# Patient Record
Sex: Female | Born: 1955 | Race: White | Hispanic: No | Marital: Married | Smoking: Never smoker
Health system: Southern US, Community
[De-identification: ages and names within clinical notes are randomized; demographics above are authoritative.]

## PROBLEM LIST (undated history)

## (undated) DIAGNOSIS — G8929 Other chronic pain: Secondary | ICD-10-CM

## (undated) DIAGNOSIS — E119 Type 2 diabetes mellitus without complications: Secondary | ICD-10-CM

## (undated) DIAGNOSIS — C4491 Basal cell carcinoma of skin, unspecified: Secondary | ICD-10-CM

## (undated) DIAGNOSIS — J329 Chronic sinusitis, unspecified: Secondary | ICD-10-CM

## (undated) DIAGNOSIS — M549 Dorsalgia, unspecified: Secondary | ICD-10-CM

## (undated) HISTORY — PX: TUBAL LIGATION: SHX77

## (undated) HISTORY — PX: APPENDECTOMY: SHX54

## (undated) HISTORY — PX: ANKLE FRACTURE SURGERY: SHX122

## (undated) HISTORY — PX: MULTIPLE TOOTH EXTRACTIONS: SHX2053

## (undated) HISTORY — PX: EYE SURGERY: SHX253

---

## 2013-10-26 ENCOUNTER — Ambulatory Visit: Payer: Self-pay

## 2013-11-09 ENCOUNTER — Ambulatory Visit: Payer: Self-pay

## 2014-12-11 ENCOUNTER — Emergency Department (HOSPITAL_COMMUNITY)
Admission: EM | Admit: 2014-12-11 | Discharge: 2014-12-11 | Disposition: A | Discharge status: Home / Self Care | Payer: 59 | Attending: Emergency Medicine | Admitting: Emergency Medicine

## 2014-12-11 ENCOUNTER — Emergency Department (HOSPITAL_COMMUNITY): Payer: 59

## 2014-12-11 ENCOUNTER — Encounter (HOSPITAL_COMMUNITY): Payer: Self-pay | Admitting: *Deleted

## 2014-12-11 DIAGNOSIS — R509 Fever, unspecified: Secondary | ICD-10-CM | POA: Diagnosis present

## 2014-12-11 DIAGNOSIS — J0101 Acute recurrent maxillary sinusitis: Secondary | ICD-10-CM | POA: Diagnosis not present

## 2014-12-11 HISTORY — DX: Type 2 diabetes mellitus without complications: E11.9

## 2014-12-11 HISTORY — DX: Other chronic pain: G89.29

## 2014-12-11 HISTORY — DX: Chronic sinusitis, unspecified: J32.9

## 2014-12-11 HISTORY — DX: Dorsalgia, unspecified: M54.9

## 2014-12-11 MED ORDER — IPRATROPIUM BROMIDE 0.02 % IN SOLN
0.5000 mg | Freq: Once | RESPIRATORY_TRACT | Status: AC
  Administered 2014-12-11: 0.5 mg via RESPIRATORY_TRACT
  Filled 2014-12-11: qty 2.5

## 2014-12-11 MED ORDER — ACETAMINOPHEN 325 MG PO TABS
650.0000 mg | ORAL_TABLET | Freq: Once | ORAL | Status: AC
  Administered 2014-12-11: 650 mg via ORAL
  Filled 2014-12-11: qty 2

## 2014-12-11 MED ORDER — ONDANSETRON 4 MG PO TBDP
8.0000 mg | ORAL_TABLET | Freq: Once | ORAL | Status: AC
  Administered 2014-12-11: 8 mg via ORAL
  Filled 2014-12-11: qty 2

## 2014-12-11 MED ORDER — PHENYLEPHRINE-DM-GG-APAP 5-10-200-325 MG PO TABS: 2.0000 | ORAL_TABLET | Freq: Three times a day (TID) | ORAL | Status: DC

## 2014-12-11 MED ORDER — ALBUTEROL SULFATE (2.5 MG/3ML) 0.083% IN NEBU
5.0000 mg | INHALATION_SOLUTION | Freq: Once | RESPIRATORY_TRACT | Status: AC
  Administered 2014-12-11: 5 mg via RESPIRATORY_TRACT
  Filled 2014-12-11: qty 6

## 2014-12-11 MED ORDER — ONDANSETRON HCL 4 MG PO TABS: 4.0000 mg | ORAL_TABLET | Freq: Four times a day (QID) | ORAL | Status: DC

## 2014-12-11 MED ORDER — PREDNISONE 20 MG PO TABS: 40.0000 mg | ORAL_TABLET | Freq: Every day | ORAL | Status: DC

## 2014-12-11 MED ORDER — IBUPROFEN 400 MG PO TABS
600.0000 mg | ORAL_TABLET | Freq: Once | ORAL | Status: AC
  Administered 2014-12-11: 400 mg via ORAL
  Filled 2014-12-11: qty 2

## 2014-12-11 MED ORDER — AEROCHAMBER PLUS FLO-VU MEDIUM MISC
1.0000 | Freq: Once | Status: AC
  Administered 2014-12-11: 1
  Filled 2014-12-11: qty 1

## 2014-12-11 MED ORDER — ALBUTEROL SULFATE HFA 108 (90 BASE) MCG/ACT IN AERS
2.0000 | INHALATION_SPRAY | RESPIRATORY_TRACT | Status: AC
  Administered 2014-12-11: 2 via RESPIRATORY_TRACT
  Filled 2014-12-11: qty 6.7

## 2014-12-11 NOTE — ED Notes (Addendum)
States was seen at the clinic on 11/12/14 and was Amox for sinus infection. States no improvement and had temp 102 - clinic prescribed Levoflaxacin and Fluticasone Spray and Claritin. States has temp 99 this am and no improvement. States has not taken any meds for temp. Pt also c/o cough - nonprod. States vomited yesterday.

## 2014-12-11 NOTE — ED Notes (Signed)
Declined W/C at D/C and was escorted to lobby by RN. 

## 2014-12-11 NOTE — ED Provider Notes (Signed)
CSN: [id number]     Arrival date & time 12/11/14  1102 History  This chart was scribed for Donetta Potts, NP, working with Elwin Mocha, MDrett Adriana Simas, ED Scribe. This patient was seen in room TR05C/TR05C and the patient's care was started at 11:30 AM.   No chief complaint on file.  The history is provided by the patient. No language interpreter was used.   HPI Comments: Sara Mcclain is a 59 y.o. female who presents to the Emergency Department complaining of sinus pressure with associated fever onset > 1 month ago as well as a non-productive cough onset within the past several days.  The patient reports she was seen at the clinic on 3/19 and prescribed amoxicillin.  Her sinus pressure improved minorly and transiently but recurred.  When she developed a fever TMAX 102 on 4/13, she returned to the clinic and was prescribed a 10 day course of levofloxacin, 5 days ago.  At that time, she was also started on Fluticasone and Mucinex.  Patient has a history of DM which is controlled with metformin and glimepiride, however, she notes it has been running in the mid to high 100s recently because she avoided taking her medicines.  She reports 4/10 pain in her face and describes it as throbbing. She denies vision changes, eye pain, current fever, neck stiffness or chills.     No current facility-administered medications on file prior to encounter.   No current outpatient prescriptions on file prior to encounter.   No past medical history on file. No past surgical history on file. No family history on file. History  Substance Use Topics  . Smoking status: Not on file  . Smokeless tobacco: Not on file  . Alcohol Use: Not on file   OB History    No data available     Review of Systems  Constitutional: Negative for fever.  HENT: Positive for postnasal drip and sinus pressure.   Respiratory: Positive for cough.     Allergies  Review of patient's allergies indicates not on file.  Home Medications    Prior to Admission medications   Not on File   There were no vitals taken for this visit. Physical Exam  Constitutional: She is oriented to person, place, and time. She appears well-developed and well-nourished. No distress.  HENT:  Head: Normocephalic and atraumatic.  Right Ear: External ear normal.  Left Ear: External ear normal.  Nose: Mucosal edema and rhinorrhea present. Right sinus exhibits maxillary sinus tenderness. Left sinus exhibits maxillary sinus tenderness.  Mouth/Throat: Oropharynx is clear and moist. Mucous membranes are not dry. No oropharyngeal exudate, posterior oropharyngeal edema or posterior oropharyngeal erythema.  Eyes: Conjunctivae and EOM are normal. Pupils are equal, round, and reactive to light.  Neck: Normal range of motion. Neck supple. No tracheal deviation present.  Cardiovascular: Normal rate, regular rhythm and intact distal pulses.   Pulmonary/Chest: Effort normal. No respiratory distress. She has wheezes ( mild) in the right middle field and the left middle field. She has no rhonchi. She has no rales. She exhibits no tenderness.  Abdominal: Soft.  Musculoskeletal: Normal range of motion.  Neurological: She is alert and oriented to person, place, and time.  Skin: Skin is warm and dry.  Psychiatric: She has a normal mood and affect.  Nursing note and vitals reviewed.   ED Course  Procedures (including critical care time)  DIAGNOSTIC STUDIES: Oxygen Saturation is 97% on RA, normal by my interpretation.    COORDINATION OF CARE:  11:45 AM Discussed treatment plan with patient at bedside.  Patient acknowledges and agrees with plan.    Labs Review Labs Reviewed - No data to display  Imaging Review Dg Chest 2 View  12/11/2014   CLINICAL DATA:  Patient with 1 month of sinus problems. Fever. Cough and congestion.  EXAM: CHEST  2 VIEW  COMPARISON:  None.  FINDINGS: Normal cardiac and mediastinal contours. No consolidative pulmonary opacities. No  pleural effusion or pneumothorax. Regional skeleton is unremarkable.  IMPRESSION: No acute cardiopulmonary process.   Electronically Signed   By: Annia Beltrew  Davis M.D.   On: 12/11/2014 12:38     EKG Interpretation None      MDM   Final diagnoses:  Acute recurrent maxillary sinusitis   59 yo complaining of symptoms of sinusitis.  She is currently half-way through course of levaquin after a course of amoxicillin.  She no longer has purulent discharge and after start of abx her Tmax has only been 99.0. CXR due to persistent cough and mild wheeze and is negative. Symptoms managed in ED and improved with albuterol neb, tylenol and ibuprofen and zofran and oral hydration.  Instructions to finish course of levaquin, use warm saline nasal wash and recommendations for follow-up with primary care physician. Also recommended prednisone daily x 5 days, albuterol inhaler 2 puffs every 4 hours for cough and multi-symptom cold medicine 3 times a day.  Pt is well-appearing, in no acute distress and vital signs reviewed and not concerning. She appears safe to be discharged.  Return precautions provided.  Pt aware of plan and in agreement.    I personally performed the services described in this documentation, which was scribed in my presence. The recorded information has been reviewed and is accurate.  Filed Vitals:   12/11/14 1112 12/11/14 1125 12/11/14 1251 12/11/14 1336  BP: 120/61   102/65  Pulse: 102   98  Temp: 99.5 F (37.5 C)  98 F (36.7 C) 98.3 F (36.8 C)  TempSrc: Oral  Oral Oral  Resp: 20   20  Height:  5' (1.524 m)    Weight:  164 lb 7 oz (74.588 kg)    SpO2: 97%   97%   Meds given in ED:  Medications  albuterol (PROVENTIL) (2.5 MG/3ML) 0.083% nebulizer solution 5 mg (5 mg Nebulization Given 12/11/14 1150)  ipratropium (ATROVENT) nebulizer solution 0.5 mg (0.5 mg Nebulization Given 12/11/14 1150)  acetaminophen (TYLENOL) tablet 650 mg (650 mg Oral Given 12/11/14 1253)  ibuprofen  (ADVIL,MOTRIN) tablet 600 mg (400 mg Oral Given 12/11/14 1253)  ondansetron (ZOFRAN-ODT) disintegrating tablet 8 mg (8 mg Oral Given 12/11/14 1149)  albuterol (PROVENTIL HFA;VENTOLIN HFA) 108 (90 BASE) MCG/ACT inhaler 2 puff (2 puffs Inhalation Given 12/11/14 1333)  AEROCHAMBER PLUS FLO-VU MEDIUM device MISC 1 each (1 each Other Given 12/11/14 1334)    Discharge Medication List as of 12/11/2014  1:17 PM     12/11/14 0000  ondansetron (ZOFRAN) 4 MG tablet Every 6 hours Discontinue Reprint 12/11/14 1315   12/11/14 0000  Phenylephrine-DM-GG-APAP 5-10-200-325 MG TABS 3 times daily Discontinue Reprint 12/11/14 1315        Harle BattiestElizabeth Baker Kogler, NP 12/11/14 1952  Elwin MochaBlair Walden, MD 12/12/14 [phone]

## 2014-12-11 NOTE — Discharge Instructions (Signed)
Please follow the directions provided. Be sure to follow-up with your primary care doctor to ensure you're getting better. Please continue your course of Levaquin until it is all gone. Please start taking the prescribed multisymptom cold medicine 3 times a day and your symptoms have resolved. You may use the Zofran as needed for any nausea. Please use the albuterol inhaler with a spacer, 2 puffs every 4 hours to help with cough and or wheezing. Be sure to drink plenty of fluids by mouth to stay well hydrated and resume taking all prescribed diabetes medicines. Don't hesitate to return for any new, worsening, or concerning symptoms.   SEEK IMMEDIATE MEDICAL CARE IF:  You have increasing pain or severe headaches.  You have nausea, vomiting, or drowsiness.  You have swelling around your face.  You have vision problems.  You have a stiff neck.  You have difficulty breathing.

## 2015-01-04 ENCOUNTER — Other Ambulatory Visit: Payer: Self-pay | Admitting: Family Medicine

## 2015-01-04 ENCOUNTER — Other Ambulatory Visit (HOSPITAL_COMMUNITY)
Admission: RE | Admit: 2015-01-04 | Discharge: 2015-01-04 | Disposition: A | Discharge status: Home / Self Care | Payer: 59 | Source: Ambulatory Visit | Attending: Family Medicine | Admitting: Family Medicine

## 2015-01-04 DIAGNOSIS — Z124 Encounter for screening for malignant neoplasm of cervix: Secondary | ICD-10-CM | POA: Diagnosis present

## 2015-01-04 DIAGNOSIS — Z1151 Encounter for screening for human papillomavirus (HPV): Secondary | ICD-10-CM | POA: Insufficient documentation

## 2015-01-09 LAB — CYTOLOGY - PAP

## 2015-01-10 ENCOUNTER — Other Ambulatory Visit: Payer: Self-pay | Admitting: Obstetrics and Gynecology

## 2015-01-11 ENCOUNTER — Other Ambulatory Visit: Payer: Self-pay

## 2015-01-11 DIAGNOSIS — Z1231 Encounter for screening mammogram for malignant neoplasm of breast: Secondary | ICD-10-CM

## 2015-01-25 ENCOUNTER — Ambulatory Visit: Payer: 59

## 2015-01-26 ENCOUNTER — Ambulatory Visit
Admission: RE | Admit: 2015-01-26 | Discharge: 2015-01-26 | Disposition: A | Discharge status: Home / Self Care | Payer: 59 | Source: Ambulatory Visit

## 2015-01-26 DIAGNOSIS — Z1231 Encounter for screening mammogram for malignant neoplasm of breast: Secondary | ICD-10-CM

## 2016-12-18 ENCOUNTER — Other Ambulatory Visit: Payer: Self-pay | Admitting: Family Medicine

## 2016-12-18 DIAGNOSIS — R1031 Right lower quadrant pain: Secondary | ICD-10-CM

## 2016-12-19 ENCOUNTER — Other Ambulatory Visit: Payer: Self-pay | Admitting: Family Medicine

## 2016-12-19 DIAGNOSIS — R1031 Right lower quadrant pain: Secondary | ICD-10-CM

## 2016-12-20 ENCOUNTER — Ambulatory Visit
Admission: RE | Admit: 2016-12-20 | Discharge: 2016-12-20 | Disposition: A | Discharge status: Home / Self Care | Payer: Commercial Managed Care - PPO | Source: Ambulatory Visit | Attending: Family Medicine | Admitting: Family Medicine

## 2016-12-20 DIAGNOSIS — R1031 Right lower quadrant pain: Secondary | ICD-10-CM

## 2017-01-27 ENCOUNTER — Other Ambulatory Visit (HOSPITAL_COMMUNITY)
Admission: RE | Admit: 2017-01-27 | Discharge: 2017-01-27 | Disposition: A | Discharge status: Home / Self Care | Payer: Commercial Managed Care - PPO | Source: Ambulatory Visit | Attending: Obstetrics and Gynecology | Admitting: Obstetrics and Gynecology

## 2017-01-27 ENCOUNTER — Other Ambulatory Visit: Payer: Self-pay | Admitting: Obstetrics and Gynecology

## 2017-01-27 DIAGNOSIS — Z01419 Encounter for gynecological examination (general) (routine) without abnormal findings: Secondary | ICD-10-CM | POA: Diagnosis not present

## 2017-01-29 LAB — CYTOLOGY - PAP
Diagnosis: NEGATIVE
HPV: NOT DETECTED

## 2017-03-05 ENCOUNTER — Ambulatory Visit: Payer: Commercial Managed Care - PPO | Admitting: Registered"

## 2017-03-20 ENCOUNTER — Other Ambulatory Visit: Payer: Self-pay | Admitting: Obstetrics and Gynecology

## 2017-03-20 DIAGNOSIS — Z1231 Encounter for screening mammogram for malignant neoplasm of breast: Secondary | ICD-10-CM

## 2017-03-21 ENCOUNTER — Ambulatory Visit
Admission: RE | Admit: 2017-03-21 | Discharge: 2017-03-21 | Disposition: A | Discharge status: Home / Self Care | Payer: Commercial Managed Care - PPO | Source: Ambulatory Visit | Attending: Obstetrics and Gynecology | Admitting: Obstetrics and Gynecology

## 2017-03-21 DIAGNOSIS — Z1231 Encounter for screening mammogram for malignant neoplasm of breast: Secondary | ICD-10-CM

## 2019-06-02 ENCOUNTER — Other Ambulatory Visit: Payer: Self-pay | Admitting: Obstetrics and Gynecology

## 2019-06-02 DIAGNOSIS — Z1231 Encounter for screening mammogram for malignant neoplasm of breast: Secondary | ICD-10-CM

## 2019-07-19 ENCOUNTER — Ambulatory Visit: Payer: Commercial Managed Care - PPO

## 2019-09-08 ENCOUNTER — Other Ambulatory Visit: Payer: Self-pay

## 2019-09-08 ENCOUNTER — Ambulatory Visit
Admission: RE | Admit: 2019-09-08 | Discharge: 2019-09-08 | Disposition: A | Discharge status: Home / Self Care | Payer: Commercial Managed Care - PPO | Source: Ambulatory Visit | Attending: Obstetrics and Gynecology | Admitting: Obstetrics and Gynecology

## 2019-09-08 DIAGNOSIS — Z1231 Encounter for screening mammogram for malignant neoplasm of breast: Secondary | ICD-10-CM

## 2019-09-24 ENCOUNTER — Ambulatory Visit (HOSPITAL_BASED_OUTPATIENT_CLINIC_OR_DEPARTMENT_OTHER)
Admission: RE | Admit: 2019-09-24 | Discharge: 2019-09-24 | Disposition: A | Discharge status: Home / Self Care | Payer: Commercial Managed Care - PPO | Source: Ambulatory Visit | Attending: Family Medicine | Admitting: Family Medicine

## 2019-09-24 ENCOUNTER — Other Ambulatory Visit: Payer: Self-pay

## 2019-09-24 ENCOUNTER — Other Ambulatory Visit (HOSPITAL_BASED_OUTPATIENT_CLINIC_OR_DEPARTMENT_OTHER): Payer: Self-pay | Admitting: Family Medicine

## 2019-09-24 DIAGNOSIS — R3989 Other symptoms and signs involving the genitourinary system: Secondary | ICD-10-CM

## 2019-09-24 DIAGNOSIS — M549 Dorsalgia, unspecified: Secondary | ICD-10-CM | POA: Diagnosis present

## 2021-08-08 IMAGING — MG DIGITAL SCREENING BILAT W/ TOMO W/ CAD
8 series · 8 of 24 positions shown · non-contrast
Comparison: Previous exam(s).

CLINICAL DATA: Screening.

EXAM:
DIGITAL SCREENING BILATERAL MAMMOGRAM WITH TOMO AND CAD

[L CC synth-2D]
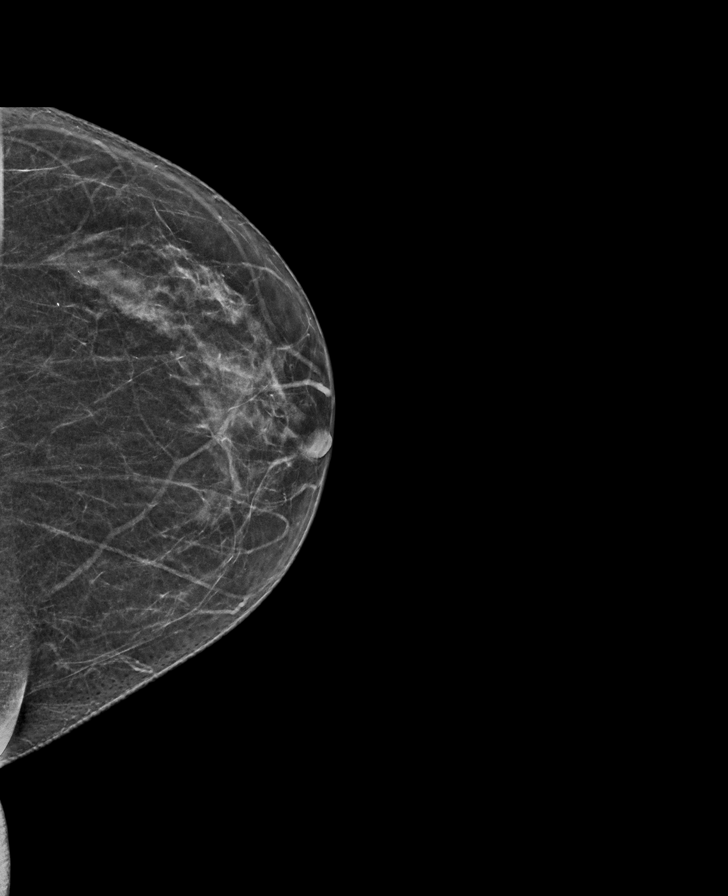

[R MLO synth-2D]
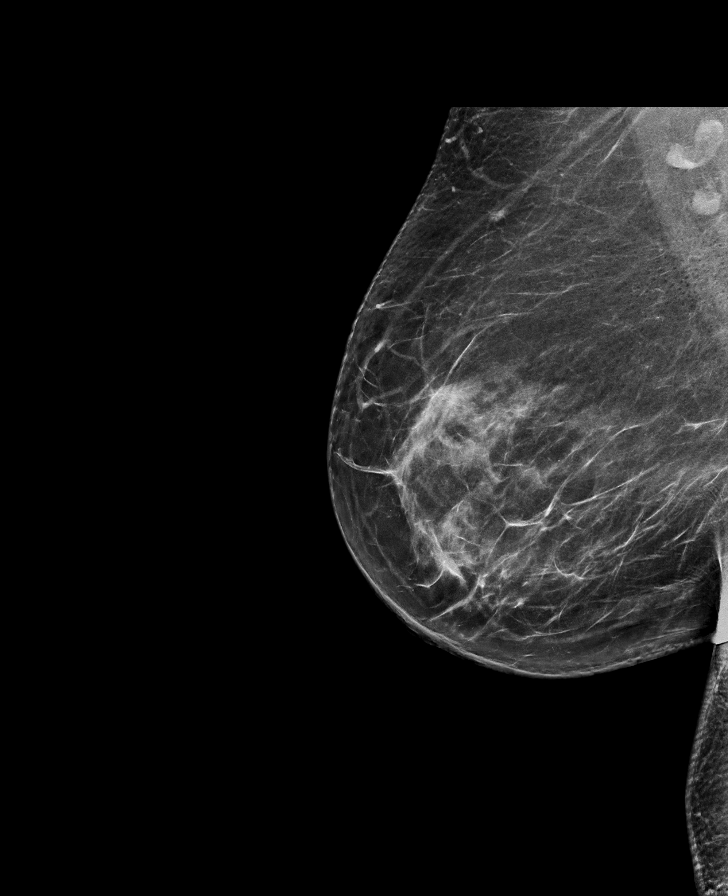

[R CC synth-2D]
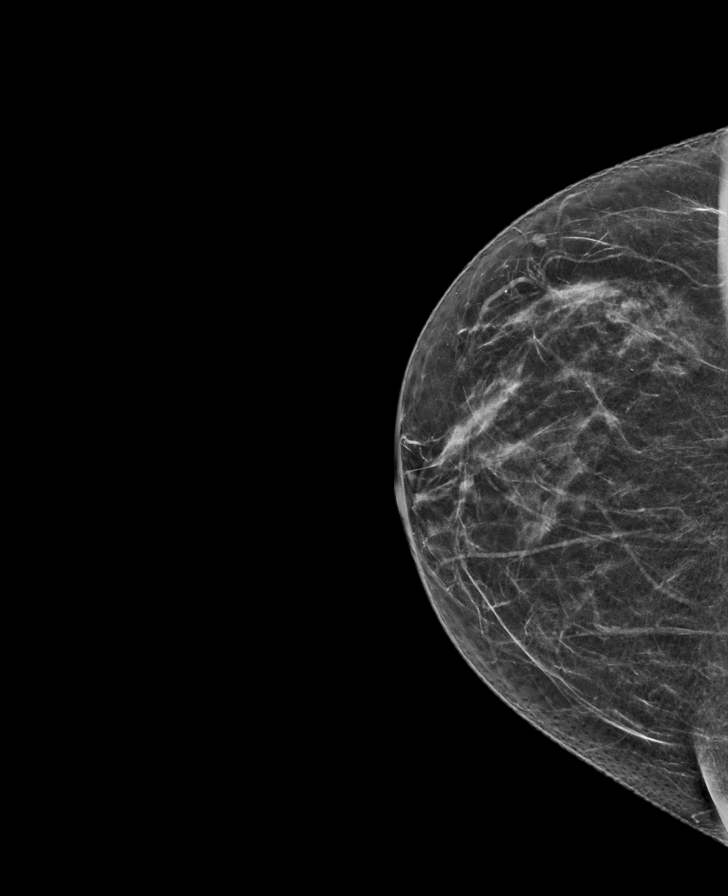

[L MLO synth-2D]
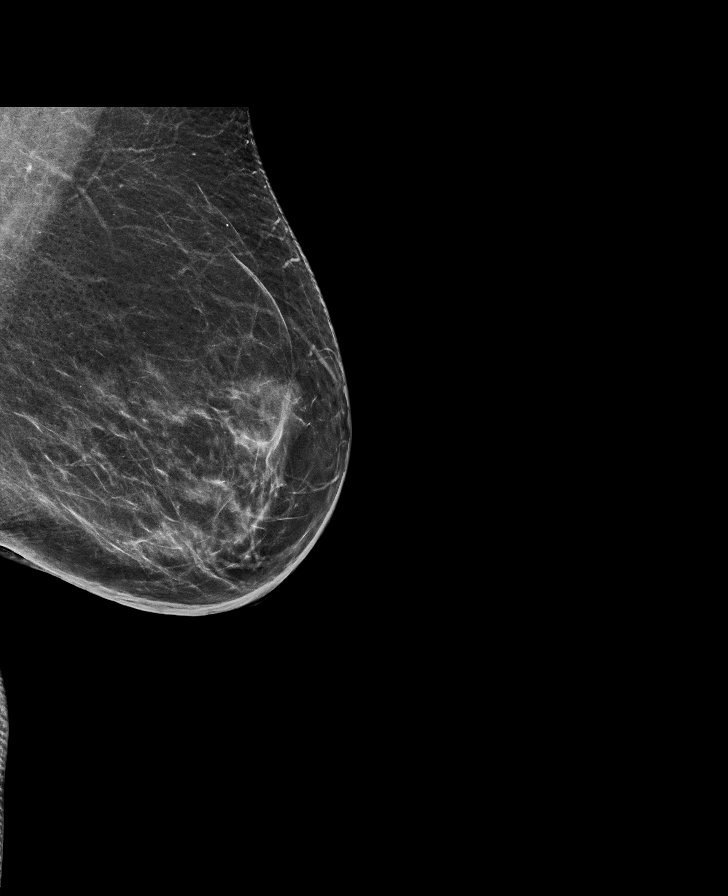

[R CC tomo · tomo slice 30/59.0]
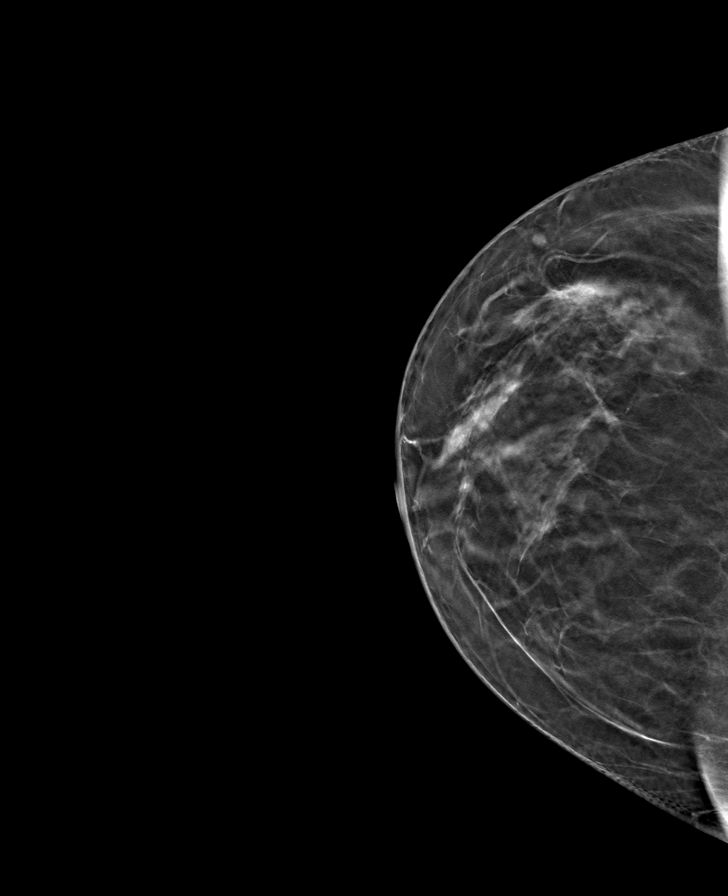

[L MLO tomo · tomo slice 37/74.0]
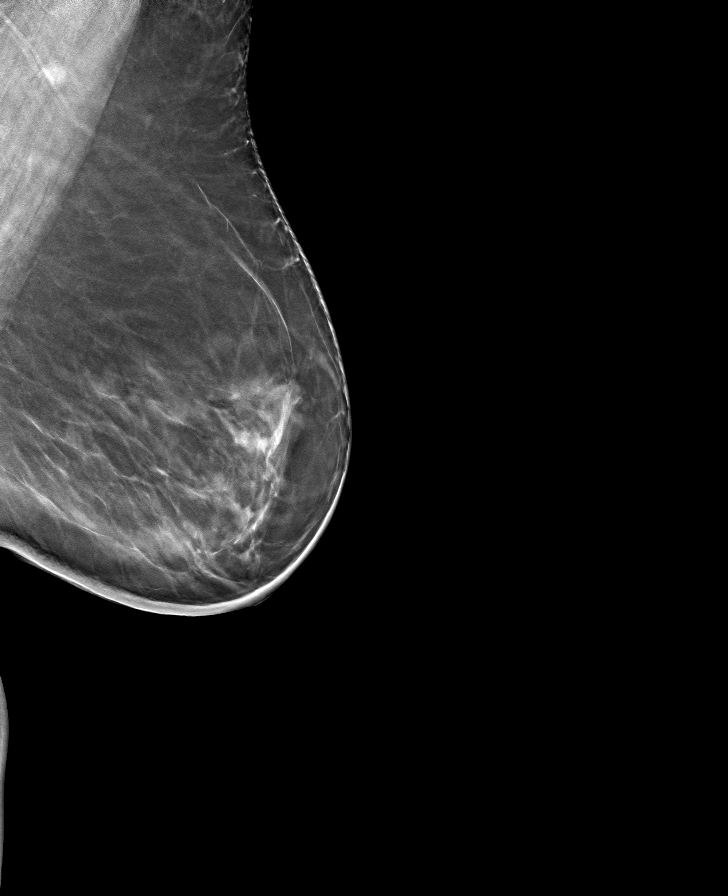

[L CC tomo · tomo slice 29/57.0]
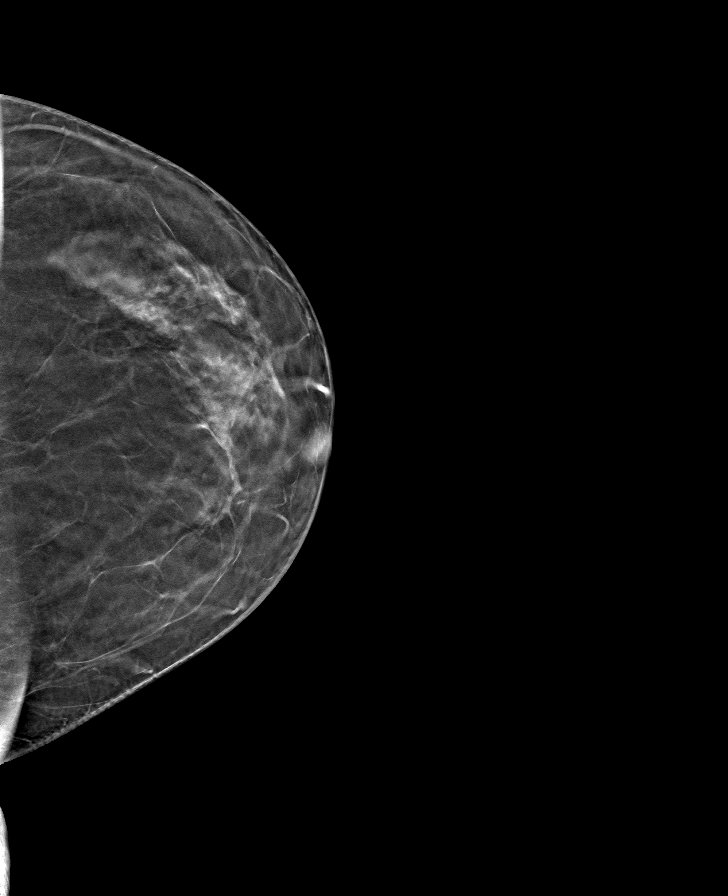

[R MLO tomo · tomo slice 40/79.0]
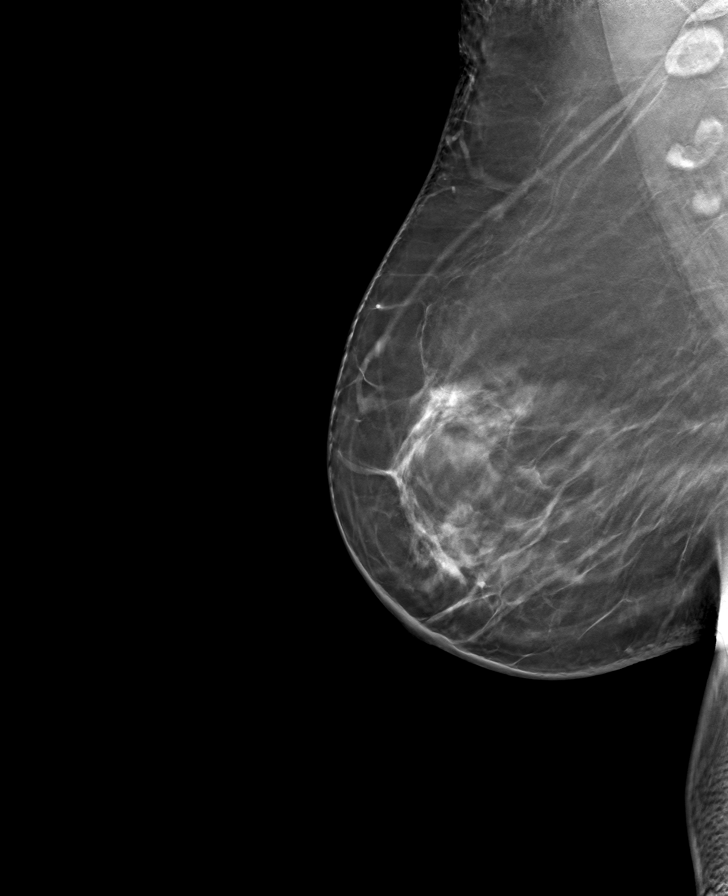

[8 of 24 positions shown; findings below may reference images not displayed]

ACR Breast Density Category b: There are scattered areas of
fibroglandular density.
FINDINGS: There are no findings suspicious for malignancy. Images were
processed with CAD.
IMPRESSION: No mammographic evidence of malignancy. A result letter of this
screening mammogram will be mailed directly to the patient.

RECOMMENDATION:
Screening mammogram in one year. (Code:[id number])

BI-RADS CATEGORY  1: Negative.

## 2021-12-16 ENCOUNTER — Encounter (HOSPITAL_BASED_OUTPATIENT_CLINIC_OR_DEPARTMENT_OTHER): Payer: Self-pay | Admitting: Emergency Medicine

## 2021-12-16 ENCOUNTER — Emergency Department (HOSPITAL_BASED_OUTPATIENT_CLINIC_OR_DEPARTMENT_OTHER)
Admission: EM | Admit: 2021-12-16 | Discharge: 2021-12-17 | Disposition: A | Discharge status: Home / Self Care | Payer: Commercial Managed Care - PPO | Attending: Emergency Medicine | Admitting: Emergency Medicine

## 2021-12-16 ENCOUNTER — Other Ambulatory Visit: Payer: Self-pay

## 2021-12-16 ENCOUNTER — Emergency Department (HOSPITAL_BASED_OUTPATIENT_CLINIC_OR_DEPARTMENT_OTHER): Payer: Commercial Managed Care - PPO

## 2021-12-16 DIAGNOSIS — S99921A Unspecified injury of right foot, initial encounter: Secondary | ICD-10-CM | POA: Diagnosis present

## 2021-12-16 DIAGNOSIS — Y990 Civilian activity done for income or pay: Secondary | ICD-10-CM | POA: Insufficient documentation

## 2021-12-16 DIAGNOSIS — E119 Type 2 diabetes mellitus without complications: Secondary | ICD-10-CM | POA: Diagnosis not present

## 2021-12-16 DIAGNOSIS — S91209A Unspecified open wound of unspecified toe(s) with damage to nail, initial encounter: Secondary | ICD-10-CM

## 2021-12-16 DIAGNOSIS — W228XXA Striking against or struck by other objects, initial encounter: Secondary | ICD-10-CM | POA: Insufficient documentation

## 2021-12-16 DIAGNOSIS — S91201A Unspecified open wound of right great toe with damage to nail, initial encounter: Secondary | ICD-10-CM | POA: Diagnosis not present

## 2021-12-16 NOTE — Discharge Instructions (Signed)
He can take Tylenol for the pain.  The nail will fall off on its own as we discussed, can wear the dressing for the next 2 days.  Take the nail back into the toe until it falls off completely.  Return if you have signs of infections.  See your primary this week for wound recheck. ?

## 2021-12-16 NOTE — ED Triage Notes (Signed)
Pt hit her left big toe on a machine at work and pulled the toe nail back. Toe nail still present, but pt states the nail came completely off. Bleeding controlled at this time. Denies blood thinners.  ?

## 2021-12-16 NOTE — ED Provider Notes (Signed)
?[facility] EMERGENCY DEPARTMENT ?Provider Note ? ? ?CSN: [id number] ?Arrival date & time: 12/16/21  1909 ? ?  ? ?History ? ?Chief Complaint  ?Patient presents with  ? Nail Problem  ? ? ?Sara Mcclain is a 66 y.o. female. ? ?HPI ? ? Patient presents with pain to the right large toe.  Happened acutely when she was moving a bingo machine, she stubbed the toe on it.  Some bleeding, the toenail was starting to fall off but has not fallen off completely.  Denies any paresthesias. Tetanus up to date.  ? ?Home Medications ?Prior to Admission medications   ?Medication Sig Start Date End Date Taking? Authorizing Provider  ?ondansetron (ZOFRAN) 4 MG tablet Take 1 tablet (4 mg total) by mouth every 6 (six) hours. 12/11/14   Harle Battiest, NP  ?Phenylephrine-DM-GG-APAP 5-10-200-325 MG TABS Take 2 tablets by mouth 3 (three) times daily. 12/11/14   Harle Battiest, NP  ?   ? ?Allergies    ?Percocet [oxycodone-acetaminophen]   ? ?Review of Systems   ?Review of Systems ? ?Physical Exam ?Updated Vital Signs ?BP (!) 136/58 (BP Location: Left Arm)   Pulse 85   Temp 98.1 ?F (36.7 ?C) (Oral)   Resp 16   Ht 5' (1.524 m)   Wt 63.5 kg   SpO2 100%   BMI 27.34 kg/m?  ?Physical Exam ?Vitals and nursing note reviewed. Exam conducted with a chaperone present.  ?Constitutional:   ?   General: She is not in acute distress. ?   Appearance: Normal appearance.  ?HENT:  ?   Head: Normocephalic and atraumatic.  ?Eyes:  ?   General: No scleral icterus. ?   Extraocular Movements: Extraocular movements intact.  ?   Pupils: Pupils are equal, round, and reactive to light.  ?Cardiovascular:  ?   Pulses: Normal pulses.  ?Skin: ?   Capillary Refill: Capillary refill takes less than 2 seconds.  ?   Coloration: Skin is not jaundiced.  ?Neurological:  ?   Mental Status: She is alert. Mental status is at baseline.  ?   Coordination: Coordination normal.  ? ? ? ? ? ?ED Results / Procedures / Treatments   ?Labs ?(all labs ordered are  listed, but only abnormal results are displayed) ?Labs Reviewed - No data to display ? ?EKG ?None ? ?Radiology ?DG Toe Great Left ? ?Result Date: 12/16/2021 ?CLINICAL DATA:  Toe injury. Patient hit her left big toe while moving a bingo machines in this p.m. EXAM: LEFT GREAT TOE COMPARISON:  None. FINDINGS: There is no evidence of fracture or dislocation. There is abnormal alignment of the nail bed concerning for nail bed injury. Soft tissue swelling about the big toe. Degenerative changes of the first metatarsophalangeal joint. IMPRESSION: 1.  No evidence of fracture or dislocation. 2.  Findings suspicious for nail bed avulsion injury. Electronically Signed   By: Larose Hires D.O.   On: 12/16/2021 20:13   ? ?Procedures ?Procedures  ? ? ?Medications Ordered in ED ?Medications - No data to display ? ?ED Course/ Medical Decision Making/ A&P ?  ?                        ?Medical Decision Making ?Amount and/or Complexity of Data Reviewed ?Radiology: ordered. ? ? ?Patient presents with toenail avulsion.  Presentation complicated somewhat by her history of diabetes.  Nail is not completely removed, x-ray was obtained and viewed by myself. No fractures, agree with radiology interpretation.  Wound was irrigated, nail wrapped in Xeroform and taped to toe.  Considered removal of nail but ultimately this will fall off on its own and do not think needs to be moved emergently.  Discussed wound care with patient, discharged in stable condition. ? ?Discussed HPI, physical exam and plan of care for this patient with attending Charlesetta Shanks. The attending physician evaluated this patient as part of a shared visit and agrees with plan of care. ? ? ? ? ? ? ? ? ?Final Clinical Impression(s) / ED Diagnoses ?Final diagnoses:  ?None  ? ? ?Rx / DC Orders ?ED Discharge Orders   ? ? None  ? ?  ? ? ?  ?Sherrill Raring, PA-C ?12/16/21 2318 ? ?  ?Charlesetta Shanks, MD ?12/17/21 1948 ? ?

## 2022-08-07 NOTE — H&P (Incomplete)
Sara Mcclain is an 66 y.o. No obstetric history on file. who is admitted for ***.  There are no problems to display for this patient.   Pertinent Gynecological History: Menses: {menses:16152} Bleeding: {uterine bleeding:32112} Contraception: {contraception:5051} Sexually transmitted diseases: {std risk:32110} Previous GYN Procedures: {previous procedures:3041388}  Last mammogram: {normal/abnormal***:32111} Date: *** Last pap: {normal/abnormal***:32111} Date: *** OB History: No obstetric history on file.   MEDICAL/FAMILY/SOCIAL HX: No LMP recorded. Patient is postmenopausal.    Past Medical History:  Diagnosis Date   Back pain, chronic    Diabetes mellitus without complication (HCC)    Sinusitis     Past Surgical History:  Procedure Laterality Date   ANKLE FRACTURE SURGERY Right    APPENDECTOMY     EYE SURGERY     TUBAL LIGATION      No family history on file.  Social History:  reports that she has never smoked. She has never used smokeless tobacco. She reports that she does not drink alcohol and does not use drugs.  ALLERGIES/MEDS:  Allergies:  Allergies  Allergen Reactions   Percocet [Oxycodone-Acetaminophen] Nausea And Vomiting    No medications prior to admission.     ROS  There were no vitals taken for this visit. Physical Exam  No results found for this or any previous visit (from the past 24 hour(s)).  No results found.   ASSESSMENT/PLAN: Sara Mcclain is a 66 y.o. No obstetric history on file. who is admitted for ***   Steva Ready, DO

## 2022-09-04 ENCOUNTER — Ambulatory Visit: Admit: 2022-09-04 | Payer: Commercial Managed Care - PPO | Admitting: Obstetrics and Gynecology

## 2022-09-04 SURGERY — DILATATION & CURETTAGE/HYSTEROSCOPY WITH MYOSURE
Anesthesia: Choice

## 2022-12-18 ENCOUNTER — Other Ambulatory Visit: Payer: Self-pay | Admitting: Obstetrics and Gynecology

## 2022-12-18 DIAGNOSIS — K5900 Constipation, unspecified: Secondary | ICD-10-CM

## 2022-12-18 DIAGNOSIS — G8929 Other chronic pain: Secondary | ICD-10-CM

## 2022-12-18 DIAGNOSIS — Z8719 Personal history of other diseases of the digestive system: Secondary | ICD-10-CM

## 2023-01-21 ENCOUNTER — Other Ambulatory Visit: Payer: Self-pay | Admitting: Obstetrics and Gynecology

## 2023-01-21 ENCOUNTER — Ambulatory Visit
Admission: RE | Admit: 2023-01-21 | Discharge: 2023-01-21 | Disposition: A | Discharge status: Home / Self Care | Payer: Commercial Managed Care - PPO | Source: Ambulatory Visit | Attending: Obstetrics and Gynecology | Admitting: Obstetrics and Gynecology

## 2023-01-21 DIAGNOSIS — K5900 Constipation, unspecified: Secondary | ICD-10-CM

## 2023-01-21 DIAGNOSIS — G8929 Other chronic pain: Secondary | ICD-10-CM

## 2023-01-21 DIAGNOSIS — Z8719 Personal history of other diseases of the digestive system: Secondary | ICD-10-CM

## 2023-01-21 MED ORDER — IOPAMIDOL (ISOVUE-300) INJECTION 61%
100.0000 mL | Freq: Once | INTRAVENOUS | Status: AC | PRN
  Administered 2023-01-21: 100 mL via INTRAVENOUS

## 2023-06-02 NOTE — H&P (Signed)
Sara Mcclain is an 67 y.o. postmenopausal G2P2 who is admitted for Hysteroscopy with Dilation and Curettage using Myosure for endometrial mass noted on ultrasound.  Patient has not had any PMB. Last year, during work-up for pelvic pain, it was noted she had an endometrial mass. Due to multiple reasons, she was unable to have this procedure done last year.  TVUS (07/04/2022): Uterus: 6.53 x 2.11 x 3.91 cm Endometrial thickness: 0.44 cmFibroid 1 1.43 cm  Fibroid 2 0.51 cm Right Ovary 1.91 cm  Left Ovary 2.26 cm Anteverted uterus. Uterine fibroids- posterior left 1.4 x 1.3 x 0.7 cm, posterior 0.5 x0.4 cm. Endometrium fluid filled with hyperechoic mass on posterior wall 0.5 x 0.5 x 0.3 cm, no BF noted. Endometrium walls thin and smooth. Bilateral ovaries WNL.No adnexal masses seen. Bowel gas noted throughout pelvis.  Patient Active Problem List   Diagnosis Date Noted   Endometrial mass 06/07/2023    MEDICAL/FAMILY/SOCIAL HX: No LMP recorded. Patient is postmenopausal.    Past Medical History:  Diagnosis Date   Back pain, chronic    Diabetes mellitus without complication (HCC)    Sinusitis     Past Surgical History:  Procedure Laterality Date   ANKLE FRACTURE SURGERY Right    APPENDECTOMY     EYE SURGERY     TUBAL LIGATION      No family history on file.  Social History:  reports that she has never smoked. She has never used smokeless tobacco. She reports that she does not drink alcohol and does not use drugs.  ALLERGIES/MEDS:  Allergies:  Allergies  Allergen Reactions   Percocet [Oxycodone-Acetaminophen] Nausea And Vomiting    No medications prior to admission.     Review of Systems  Constitutional: Negative.   HENT: Negative.    Eyes: Negative.   Respiratory: Negative.    Cardiovascular: Negative.   Gastrointestinal: Negative.   Genitourinary: Negative.   Skin: Negative.   Endo/Heme/Allergies: Negative.   Psychiatric/Behavioral: Negative.      There  were no vitals taken for this visit. Gen:  NAD, pleasant and cooperative Cardio:  RRR Pulm:  CTAB, no wheezes/rales/rhonchi Abd:  Soft, non-distended, non-tender throughout, no rebound/guarding Ext:  No bilateral LE edema, no bilateral calf tenderness  No results found for this or any previous visit (from the past 24 hour(s)).  No results found.   ASSESSMENT/PLAN: Sara Mcclain is a 67 y.o. postmenopausal G2P2 who is admitted for Hysteroscopy with Dilation and Curettage using Myosure for endometrial mass noted on ultrasound.  - Admit to [facility] Main OR - Admit labs (CBC, T&S) - Diet:  Per anesthesia - IVF:  Per anesthesia - VTE Prophylaxis:  SCDs - Antibiotics: None - D/C home same-day  Consents: I discussed with the patient that this surgery is performed to look inside the uterus and remove the uterine lining.  Prior to surgery, the risks and benefits of the surgery, as well as alternative treatments, have been discussed.  The risks include, but are not limited to bleeding, including the need for a blood transfusion, infection, damage to organs and tissues, including uterine perforation, requiring additional surgery, postoperative pain, short-term and long-term, failure of the procedure to control symptoms, need for hysterectomy to control bleeding, fluid overload, which could create electrolyte abnormalities and the need to stop the procedure before completion, inability to safely complete the procedure, deep vein thrombosis and/or pulmonary embolism, painful intercourse, complications the course of which cannot be predicted or prevented, and death.  Patient was consented for  blood products.  The patient is aware that bleeding may result in the need for a blood transfusion which includes risk of transmission of HIV (1:2 million), Hepatitis C (1:2 million), and Hepatitis B (1:200 thousand) and transfusion reaction.  Patient voiced understanding of the above risks as well as understanding of  indications for blood transfusion.   Steva Ready, DO

## 2023-06-07 DIAGNOSIS — N9489 Other specified conditions associated with female genital organs and menstrual cycle: Secondary | ICD-10-CM | POA: Insufficient documentation

## 2023-06-18 DIAGNOSIS — N9489 Other specified conditions associated with female genital organs and menstrual cycle: Secondary | ICD-10-CM | POA: Insufficient documentation

## 2023-07-17 DIAGNOSIS — J302 Other seasonal allergic rhinitis: Secondary | ICD-10-CM | POA: Diagnosis not present

## 2023-07-17 DIAGNOSIS — E7800 Pure hypercholesterolemia, unspecified: Secondary | ICD-10-CM | POA: Diagnosis not present

## 2023-07-17 DIAGNOSIS — E1165 Type 2 diabetes mellitus with hyperglycemia: Secondary | ICD-10-CM | POA: Diagnosis not present

## 2023-07-17 DIAGNOSIS — Z23 Encounter for immunization: Secondary | ICD-10-CM | POA: Diagnosis not present

## 2023-07-17 DIAGNOSIS — Z6826 Body mass index (BMI) 26.0-26.9, adult: Secondary | ICD-10-CM | POA: Diagnosis not present

## 2023-07-17 DIAGNOSIS — I700 Atherosclerosis of aorta: Secondary | ICD-10-CM | POA: Diagnosis not present

## 2023-07-28 DIAGNOSIS — E1165 Type 2 diabetes mellitus with hyperglycemia: Secondary | ICD-10-CM | POA: Diagnosis not present

## 2023-07-28 DIAGNOSIS — Z01818 Encounter for other preprocedural examination: Secondary | ICD-10-CM | POA: Diagnosis not present

## 2023-07-28 DIAGNOSIS — N9489 Other specified conditions associated with female genital organs and menstrual cycle: Secondary | ICD-10-CM | POA: Diagnosis not present

## 2023-07-28 NOTE — H&P (Signed)
Sara Mcclain is an 67 y.o. postmenopausal G2P2 who is admitted for Hysteroscopy with Dilation and Curettage using Myosure for endometrial mass noted on ultrasound.  Patient has not had any PMB. Last year, during work-up for pelvic pain, it was noted she had an endometrial mass. Due to multiple reasons, she was unable to have this procedure done last year.  TVUS (07/04/2022): Uterus: 6.53 x 2.11 x 3.91 cm Endometrial thickness: 0.44 cmFibroid 1 1.43 cm  Fibroid 2 0.51 cm Right Ovary 1.91 cm  Left Ovary 2.26 cm Anteverted uterus. Uterine fibroids- posterior left 1.4 x 1.3 x 0.7 cm, posterior 0.5 x0.4 cm. Endometrium fluid filled with hyperechoic mass on posterior wall 0.5 x 0.5 x 0.3 cm, no BF noted. Endometrium walls thin and smooth. Bilateral ovaries WNL.No adnexal masses seen. Bowel gas noted throughout pelvis.  Patient Active Problem List   Diagnosis Date Noted   Endometrial mass 06/07/2023    MEDICAL/FAMILY/SOCIAL HX: No LMP recorded. Patient is postmenopausal.    Past Medical History:  Diagnosis Date   Back pain, chronic    Diabetes mellitus without complication (HCC)    Sinusitis     Past Surgical History:  Procedure Laterality Date   ANKLE FRACTURE SURGERY Right    APPENDECTOMY     EYE SURGERY     TUBAL LIGATION      No family history on file.  Social History:  reports that she has never smoked. She has never used smokeless tobacco. She reports that she does not drink alcohol and does not use drugs.  ALLERGIES/MEDS:  Allergies:  Allergies  Allergen Reactions   Percocet [Oxycodone-Acetaminophen] Nausea And Vomiting    No medications prior to admission.     Review of Systems  Constitutional: Negative.   HENT: Negative.    Eyes: Negative.   Respiratory: Negative.    Cardiovascular: Negative.   Gastrointestinal: Negative.   Genitourinary: Negative.   Skin: Negative.   Endo/Heme/Allergies: Negative.   Psychiatric/Behavioral: Negative.      There  were no vitals taken for this visit. Gen:  NAD, pleasant and cooperative Cardio:  RRR Pulm:  CTAB, no wheezes/rales/rhonchi Abd:  Soft, non-distended, non-tender throughout, no rebound/guarding Ext:  No bilateral LE edema, no bilateral calf tenderness  No results found for this or any previous visit (from the past 24 hour(s)).  No results found.   ASSESSMENT/PLAN: Sara Mcclain is a 67 y.o. postmenopausal G2P2 who is admitted for Hysteroscopy with Dilation and Curettage using Myosure for endometrial mass noted on ultrasound.  - Admit to [facility] Main OR - Admit labs (CBC, T&S) - Diet:  Per anesthesia - IVF:  Per anesthesia - VTE Prophylaxis:  SCDs - Antibiotics: None - D/C home same-day  Consents: I discussed with the patient that this surgery is performed to look inside the uterus and remove the uterine lining.  Prior to surgery, the risks and benefits of the surgery, as well as alternative treatments, have been discussed.  The risks include, but are not limited to bleeding, including the need for a blood transfusion, infection, damage to organs and tissues, including uterine perforation, requiring additional surgery, postoperative pain, short-term and long-term, failure of the procedure to control symptoms, need for hysterectomy to control bleeding, fluid overload, which could create electrolyte abnormalities and the need to stop the procedure before completion, inability to safely complete the procedure, deep vein thrombosis and/or pulmonary embolism, painful intercourse, complications the course of which cannot be predicted or prevented, and death.  Patient was consented for  blood products.  The patient is aware that bleeding may result in the need for a blood transfusion which includes risk of transmission of HIV (1:2 million), Hepatitis C (1:2 million), and Hepatitis B (1:200 thousand) and transfusion reaction.  Patient voiced understanding of the above risks as well as understanding of  indications for blood transfusion.   Steva Ready, DO

## 2023-08-04 DIAGNOSIS — E785 Hyperlipidemia, unspecified: Secondary | ICD-10-CM | POA: Diagnosis not present

## 2023-08-04 DIAGNOSIS — E1165 Type 2 diabetes mellitus with hyperglycemia: Secondary | ICD-10-CM | POA: Diagnosis not present

## 2023-08-04 DIAGNOSIS — I10 Essential (primary) hypertension: Secondary | ICD-10-CM | POA: Diagnosis not present

## 2023-08-10 DIAGNOSIS — J018 Other acute sinusitis: Secondary | ICD-10-CM | POA: Diagnosis not present

## 2023-08-13 ENCOUNTER — Ambulatory Visit (HOSPITAL_BASED_OUTPATIENT_CLINIC_OR_DEPARTMENT_OTHER): Admission: RE | Admit: 2023-08-13 | Payer: Self-pay | Source: Home / Self Care | Admitting: Obstetrics and Gynecology

## 2023-08-13 DIAGNOSIS — N9489 Other specified conditions associated with female genital organs and menstrual cycle: Secondary | ICD-10-CM | POA: Insufficient documentation

## 2023-08-13 SURGERY — DILATATION & CURETTAGE/HYSTEROSCOPY WITH MYOSURE
Anesthesia: Choice

## 2023-08-14 DIAGNOSIS — J0100 Acute maxillary sinusitis, unspecified: Secondary | ICD-10-CM | POA: Diagnosis not present

## 2023-08-14 DIAGNOSIS — Z6826 Body mass index (BMI) 26.0-26.9, adult: Secondary | ICD-10-CM | POA: Diagnosis not present

## 2023-09-02 DIAGNOSIS — N390 Urinary tract infection, site not specified: Secondary | ICD-10-CM | POA: Diagnosis not present

## 2023-11-16 IMAGING — DX DG TOE GREAT 2+V*L*
3 series · 3 of 3 positions shown · non-contrast
Comparison: None.

CLINICAL DATA: Toe injury. Patient hit her left big toe while
moving a bingo machines in this p.m.

EXAM:
LEFT GREAT TOE

[toe ap]
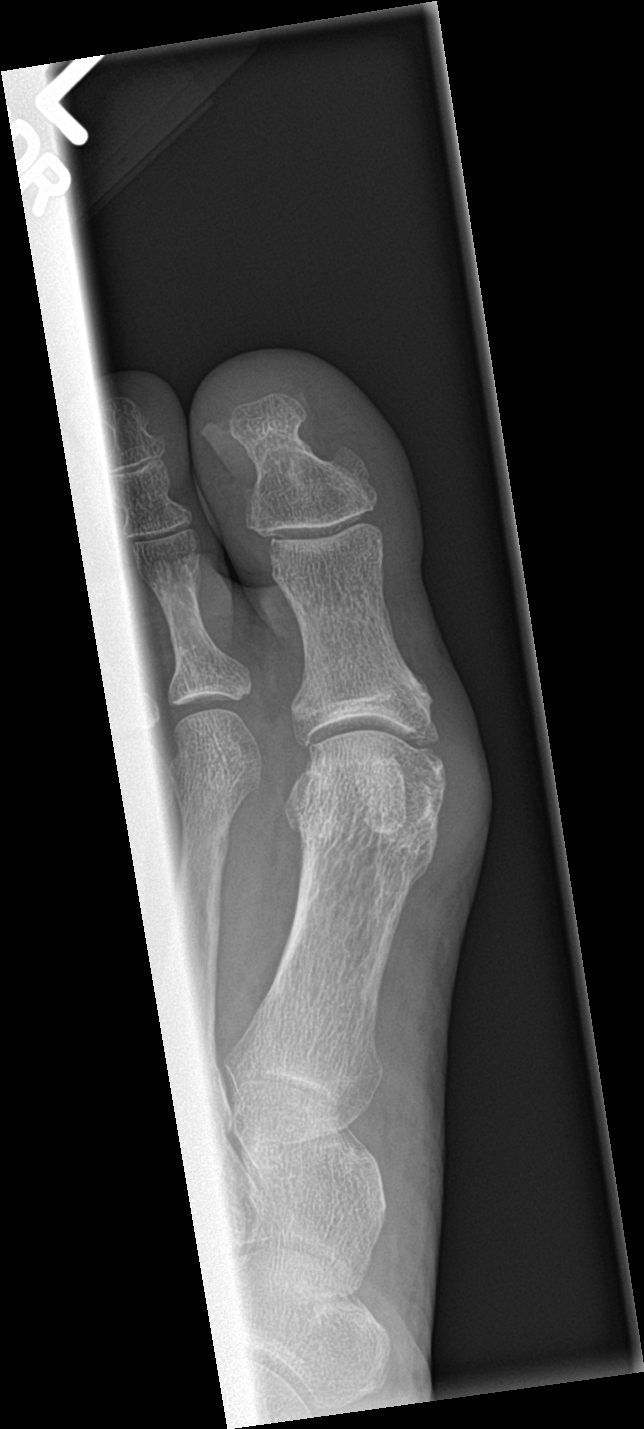

[toe obl]
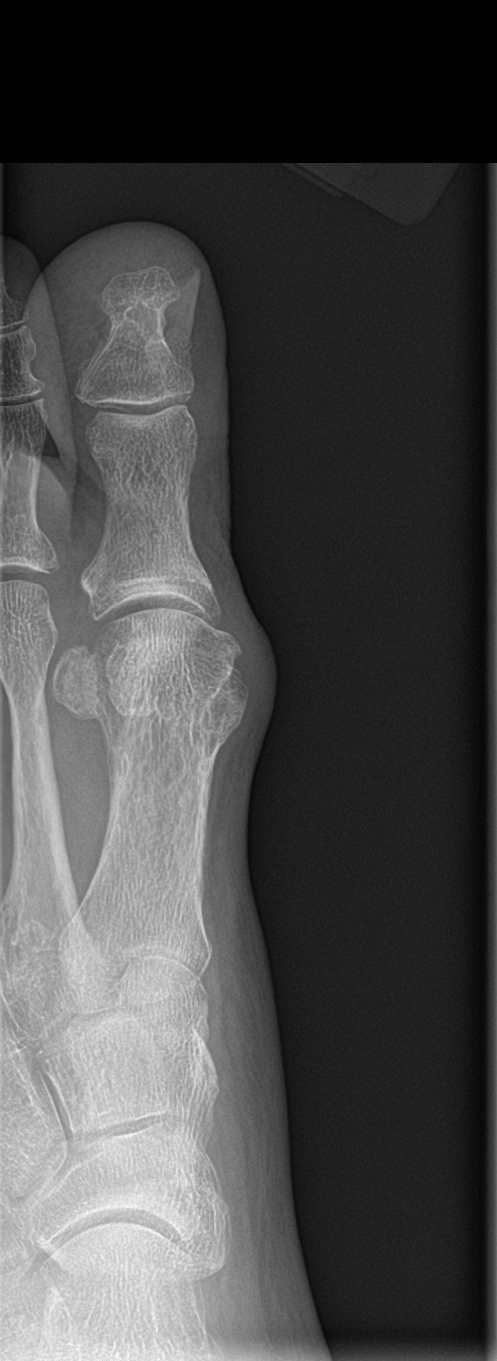

[toe lat]
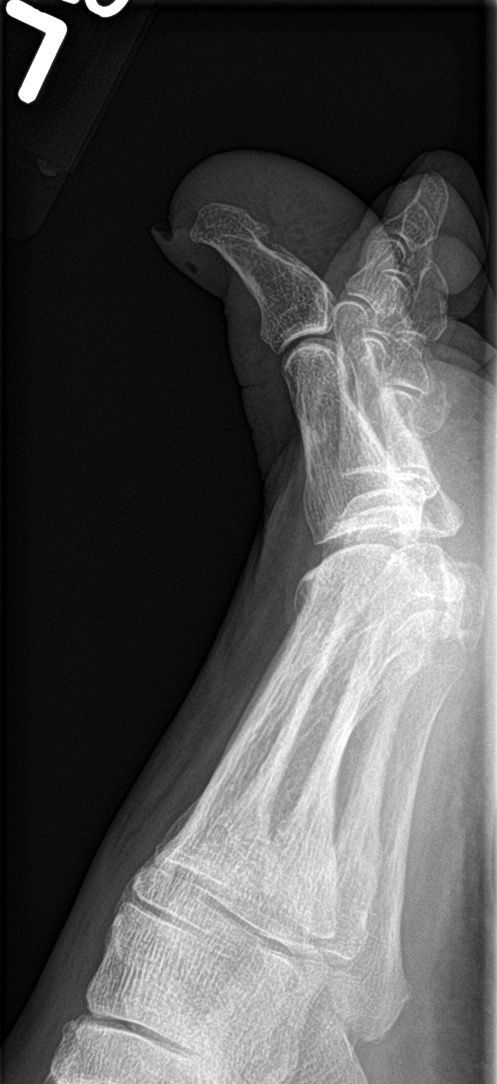

[3 of 3 positions shown; findings below may reference images not displayed]

FINDINGS: There is no evidence of fracture or dislocation. There is abnormal
alignment of the nail bed concerning for nail bed injury. Soft
tissue swelling about the big toe. Degenerative changes of the first
metatarsophalangeal joint.
IMPRESSION: 1.  No evidence of fracture or dislocation.

2.  Findings suspicious for nail bed avulsion injury.

## 2023-12-03 DIAGNOSIS — E1165 Type 2 diabetes mellitus with hyperglycemia: Secondary | ICD-10-CM | POA: Diagnosis not present

## 2023-12-08 DIAGNOSIS — Z6826 Body mass index (BMI) 26.0-26.9, adult: Secondary | ICD-10-CM | POA: Diagnosis not present

## 2023-12-08 DIAGNOSIS — L570 Actinic keratosis: Secondary | ICD-10-CM | POA: Diagnosis not present

## 2023-12-08 DIAGNOSIS — L821 Other seborrheic keratosis: Secondary | ICD-10-CM | POA: Diagnosis not present

## 2023-12-08 DIAGNOSIS — R399 Unspecified symptoms and signs involving the genitourinary system: Secondary | ICD-10-CM | POA: Diagnosis not present

## 2023-12-08 DIAGNOSIS — Z85828 Personal history of other malignant neoplasm of skin: Secondary | ICD-10-CM | POA: Diagnosis not present

## 2023-12-08 DIAGNOSIS — L820 Inflamed seborrheic keratosis: Secondary | ICD-10-CM | POA: Diagnosis not present

## 2023-12-08 DIAGNOSIS — Z129 Encounter for screening for malignant neoplasm, site unspecified: Secondary | ICD-10-CM | POA: Diagnosis not present

## 2023-12-10 DIAGNOSIS — I10 Essential (primary) hypertension: Secondary | ICD-10-CM | POA: Diagnosis not present

## 2023-12-10 DIAGNOSIS — E1165 Type 2 diabetes mellitus with hyperglycemia: Secondary | ICD-10-CM | POA: Diagnosis not present

## 2023-12-10 DIAGNOSIS — E781 Pure hyperglyceridemia: Secondary | ICD-10-CM | POA: Diagnosis not present

## 2023-12-10 DIAGNOSIS — E785 Hyperlipidemia, unspecified: Secondary | ICD-10-CM | POA: Diagnosis not present

## 2024-01-01 ENCOUNTER — Other Ambulatory Visit: Payer: Self-pay | Admitting: Family Medicine

## 2024-01-01 DIAGNOSIS — E2839 Other primary ovarian failure: Secondary | ICD-10-CM | POA: Diagnosis not present

## 2024-01-01 DIAGNOSIS — Z1231 Encounter for screening mammogram for malignant neoplasm of breast: Secondary | ICD-10-CM

## 2024-01-01 DIAGNOSIS — E7800 Pure hypercholesterolemia, unspecified: Secondary | ICD-10-CM | POA: Diagnosis not present

## 2024-01-01 DIAGNOSIS — Z0183 Encounter for blood typing: Secondary | ICD-10-CM | POA: Diagnosis not present

## 2024-01-01 DIAGNOSIS — Z1159 Encounter for screening for other viral diseases: Secondary | ICD-10-CM | POA: Diagnosis not present

## 2024-01-01 DIAGNOSIS — Z0000 Encounter for general adult medical examination without abnormal findings: Secondary | ICD-10-CM | POA: Diagnosis not present

## 2024-01-26 NOTE — H&P (Signed)
 Sara Mcclain is an 68 y.o. postmenopausal G2P2 who is admitted for Hysteroscopy with Dilation and Curettage using Myosure for endometrial mass noted on ultrasound.  Patient has not had any PMB. Last year, during work-up for pelvic pain, it was noted she had an endometrial mass. Due to multiple reasons, she was unable to have this procedure done last year.  TVUS (07/04/2022): Uterus: 6.53 x 2.11 x 3.91 cm Endometrial thickness: 0.44 cmFibroid 1 1.43 cm  Fibroid 2 0.51 cm Right Ovary 1.91 cm  Left Ovary 2.26 cm Anteverted uterus. Uterine fibroids- posterior left 1.4 x 1.3 x 0.7 cm, posterior 0.5 x0.4 cm. Endometrium fluid filled with hyperechoic mass on posterior wall 0.5 x 0.5 x 0.3 cm, no BF noted. Endometrium walls thin and smooth. Bilateral ovaries WNL.No adnexal masses seen. Bowel gas noted throughout pelvis.  Patient Active Problem List   Diagnosis Date Noted   Endometrial mass 06/07/2023    MEDICAL/FAMILY/SOCIAL HX: No LMP recorded. Patient is postmenopausal.    Past Medical History:  Diagnosis Date   Back pain, chronic    Diabetes mellitus without complication (HCC)    Sinusitis     Past Surgical History:  Procedure Laterality Date   ANKLE FRACTURE SURGERY Right    APPENDECTOMY     EYE SURGERY     TUBAL LIGATION      No family history on file.  Social History:  reports that she has never smoked. She has never used smokeless tobacco. She reports that she does not drink alcohol and does not use drugs.  ALLERGIES/MEDS:  Allergies:  Allergies  Allergen Reactions   Percocet [Oxycodone-Acetaminophen ] Nausea And Vomiting    No medications prior to admission.     Review of Systems  Constitutional: Negative.   HENT: Negative.    Eyes: Negative.   Respiratory: Negative.    Cardiovascular: Negative.   Gastrointestinal: Negative.   Genitourinary: Negative.   Skin: Negative.   Endo/Heme/Allergies: Negative.   Psychiatric/Behavioral: Negative.      There  were no vitals taken for this visit. Gen:  NAD, pleasant and cooperative Cardio:  RRR Pulm:  CTAB, no wheezes/rales/rhonchi Abd:  Soft, non-distended, non-tender throughout, no rebound/guarding Ext:  No bilateral LE edema, no bilateral calf tenderness  No results found for this or any previous visit (from the past 24 hours).  No results found.   ASSESSMENT/PLAN: Sara Mcclain is a 68 y.o. postmenopausal G2P2 who is admitted for Hysteroscopy with Dilation and Curettage using Myosure for endometrial mass noted on ultrasound.  - Admit to [facility] OR - Admit labs (CBC, T&S) - Diet:  Per anesthesia - IVF:  Per anesthesia - VTE Prophylaxis:  SCDs - Antibiotics: None - D/C home same-day  Consents: I discussed with the patient that this surgery is performed to look inside the uterus and remove the uterine lining.  Prior to surgery, the risks and benefits of the surgery, as well as alternative treatments, have been discussed.  The risks include, but are not limited to bleeding, including the need for a blood transfusion, infection, damage to organs and tissues, including uterine perforation, requiring additional surgery, postoperative pain, short-term and long-term, failure of the procedure to control symptoms, need for hysterectomy to control bleeding, fluid overload, which could create electrolyte abnormalities and the need to stop the procedure before completion, inability to safely complete the procedure, deep vein thrombosis and/or pulmonary embolism, painful intercourse, complications the course of which cannot be predicted or prevented, and death.  Patient was consented for  blood products.  The patient is aware that bleeding may result in the need for a blood transfusion which includes risk of transmission of HIV (1:2 million), Hepatitis C (1:2 million), and Hepatitis B (1:200 thousand) and transfusion reaction.  Patient voiced understanding of the above risks as well as understanding of  indications for blood transfusion.   Yosiah Jasmin, DO

## 2024-01-28 DIAGNOSIS — Z01818 Encounter for other preprocedural examination: Secondary | ICD-10-CM | POA: Diagnosis not present

## 2024-01-28 DIAGNOSIS — N9489 Other specified conditions associated with female genital organs and menstrual cycle: Secondary | ICD-10-CM | POA: Diagnosis not present

## 2024-02-03 ENCOUNTER — Telehealth (HOSPITAL_BASED_OUTPATIENT_CLINIC_OR_DEPARTMENT_OTHER): Payer: Self-pay

## 2024-02-05 ENCOUNTER — Other Ambulatory Visit: Payer: Self-pay

## 2024-02-05 ENCOUNTER — Encounter (HOSPITAL_COMMUNITY): Payer: Self-pay | Admitting: Obstetrics and Gynecology

## 2024-02-05 NOTE — Progress Notes (Signed)
 SDW CALL  Patient was given pre-op instructions over the phone. The opportunity was given for the patient to ask questions. No further questions asked. Patient verbalized understanding of instructions given.   PCP - Dr. Annabell Key Cardiologist -   PPM/ICD - denies Device Orders - n/a Rep Notified - n/a  Chest x-ray -  EKG - DOS Stress Test -  ECHO -  Cardiac Cath -   Sleep Study - per patient 5 years ago CPAP - no  Fasting Blood Sugar - per patient blood sugar ranges 150-180 Checks Blood Sugar daily  Last dose of GLP1 agonist-  tirzepatide (MOUNJARO)  GLP1 instructions: LAST DOSE 01-25-24  Blood Thinner Instructions:denies Aspirin Instructions:n/a  ERAS Protcol -clear liquids until 9:00   COVID TEST- n/a   Anesthesia review: no  Patient denies shortness of breath, fever, cough and chest pain over the phone call   All instructions explained to the patient, with a verbal understanding of the material. Patient agrees to go over the instructions while at home for a better understanding.

## 2024-02-10 ENCOUNTER — Ambulatory Visit (HOSPITAL_COMMUNITY): Admitting: Anesthesiology

## 2024-02-10 ENCOUNTER — Encounter (HOSPITAL_COMMUNITY): Payer: Self-pay | Admitting: Obstetrics and Gynecology

## 2024-02-10 ENCOUNTER — Encounter (HOSPITAL_COMMUNITY)
Admission: RE | Disposition: A | Discharge status: Home / Self Care | Payer: Self-pay | Source: Home / Self Care | Attending: Obstetrics and Gynecology

## 2024-02-10 ENCOUNTER — Ambulatory Visit (HOSPITAL_COMMUNITY)
Admission: RE | Admit: 2024-02-10 | Discharge: 2024-02-10 | Disposition: A | Discharge status: Home / Self Care | Attending: Obstetrics and Gynecology | Admitting: Obstetrics and Gynecology

## 2024-02-10 ENCOUNTER — Other Ambulatory Visit: Payer: Self-pay

## 2024-02-10 DIAGNOSIS — N9489 Other specified conditions associated with female genital organs and menstrual cycle: Secondary | ICD-10-CM | POA: Diagnosis not present

## 2024-02-10 DIAGNOSIS — Z7984 Long term (current) use of oral hypoglycemic drugs: Secondary | ICD-10-CM | POA: Insufficient documentation

## 2024-02-10 DIAGNOSIS — N841 Polyp of cervix uteri: Secondary | ICD-10-CM | POA: Diagnosis not present

## 2024-02-10 DIAGNOSIS — N840 Polyp of corpus uteri: Secondary | ICD-10-CM | POA: Insufficient documentation

## 2024-02-10 DIAGNOSIS — E119 Type 2 diabetes mellitus without complications: Secondary | ICD-10-CM | POA: Diagnosis not present

## 2024-02-10 DIAGNOSIS — R9389 Abnormal findings on diagnostic imaging of other specified body structures: Secondary | ICD-10-CM | POA: Diagnosis not present

## 2024-02-10 HISTORY — PX: HYSTEROSCOPY WITH D & C: SHX1775

## 2024-02-10 HISTORY — PX: MYOSURE RESECTION: SHX7611

## 2024-02-10 HISTORY — DX: Basal cell carcinoma of skin, unspecified: C44.91

## 2024-02-10 LAB — CBC
HCT: 38.3 % (ref 36.0–46.0)
Hemoglobin: 12.1 g/dL (ref 12.0–15.0)
MCH: 28.5 pg (ref 26.0–34.0)
MCHC: 31.6 g/dL (ref 30.0–36.0)
MCV: 90.1 fL (ref 80.0–100.0)
Platelets: 294 10*3/uL (ref 150–400)
RBC: 4.25 MIL/uL (ref 3.87–5.11)
RDW: 12.1 % (ref 11.5–15.5)
WBC: 7.1 10*3/uL (ref 4.0–10.5)
nRBC: 0 % (ref 0.0–0.2)

## 2024-02-10 LAB — BASIC METABOLIC PANEL WITH GFR
Anion gap: 14 (ref 5–15)
BUN: 19 mg/dL (ref 8–23)
CO2: 20 mmol/L — ABNORMAL LOW (ref 22–32)
Calcium: 9.6 mg/dL (ref 8.9–10.3)
Chloride: 102 mmol/L (ref 98–111)
Creatinine, Ser: 0.62 mg/dL (ref 0.44–1.00)
GFR, Estimated: >60 mL/min (ref >60)
Glucose, Bld: 227 mg/dL — ABNORMAL HIGH (ref 70–99)
Potassium: 4.1 mmol/L (ref 3.5–5.1)
Sodium: 136 mmol/L (ref 135–145)

## 2024-02-10 LAB — ABO/RH: ABO/RH(D): A NEG

## 2024-02-10 LAB — GLUCOSE, CAPILLARY
Glucose-Capillary: 229 mg/dL — ABNORMAL HIGH (ref 70–99)
Glucose-Capillary: 120 mg/dL — ABNORMAL HIGH (ref 70–99)
Glucose-Capillary: 97 mg/dL (ref 70–99)

## 2024-02-10 LAB — TYPE AND SCREEN
ABO/RH(D): A NEG
Antibody Screen: NEGATIVE

## 2024-02-10 SURGERY — DILATATION AND CURETTAGE /HYSTEROSCOPY
Anesthesia: General | Site: Uterus

## 2024-02-10 MED ORDER — SILVER NITRATE-POT NITRATE 75-25 % EX MISC
CUTANEOUS | Status: DC | PRN
  Administered 2024-02-10: 2

## 2024-02-10 MED ORDER — ORAL CARE MOUTH RINSE: 15.0000 mL | Freq: Once | OROMUCOSAL | Status: AC

## 2024-02-10 MED ORDER — SODIUM CHLORIDE 0.9 % IR SOLN
Status: DC | PRN
  Administered 2024-02-10: 3000 mL

## 2024-02-10 MED ORDER — LIDOCAINE 2% (20 MG/ML) 5 ML SYRINGE
INTRAMUSCULAR | Status: DC | PRN
Start: 2024-02-10 — End: 2024-02-10
  Administered 2024-02-10: 60 mg via INTRAVENOUS

## 2024-02-10 MED ORDER — LACTATED RINGERS IV SOLN: INTRAVENOUS | Status: DC

## 2024-02-10 MED ORDER — FENTANYL CITRATE (PF) 250 MCG/5ML IJ SOLN
INTRAMUSCULAR | Status: AC
  Filled 2024-02-10: qty 5

## 2024-02-10 MED ORDER — FENTANYL CITRATE (PF) 250 MCG/5ML IJ SOLN
INTRAMUSCULAR | Status: DC | PRN
  Administered 2024-02-10 (×2): 50 ug via INTRAVENOUS

## 2024-02-10 MED ORDER — PROPOFOL 500 MG/50ML IV EMUL
INTRAVENOUS | Status: DC | PRN
Start: 2024-02-10 — End: 2024-02-10
  Administered 2024-02-10: 100 ug/kg/min via INTRAVENOUS
  Administered 2024-02-10: 100 ug via INTRAVENOUS

## 2024-02-10 MED ORDER — PROPOFOL 10 MG/ML IV BOLUS
INTRAVENOUS | Status: AC
  Filled 2024-02-10: qty 20

## 2024-02-10 MED ORDER — ONDANSETRON HCL 4 MG/2ML IJ SOLN
INTRAMUSCULAR | Status: DC | PRN
Start: 2024-02-10 — End: 2024-02-10
  Administered 2024-02-10: 4 mg via INTRAVENOUS

## 2024-02-10 MED ORDER — DEXAMETHASONE SODIUM PHOSPHATE 10 MG/ML IJ SOLN
INTRAMUSCULAR | Status: DC | PRN
  Administered 2024-02-10: 5 mg via INTRAVENOUS

## 2024-02-10 MED ORDER — MIDAZOLAM HCL 2 MG/2ML IJ SOLN
INTRAMUSCULAR | Status: AC
Start: 2024-02-10 — End: 2024-02-10
  Filled 2024-02-10: qty 2

## 2024-02-10 MED ORDER — INSULIN ASPART 100 UNIT/ML IJ SOLN
INTRAMUSCULAR | Status: AC
  Filled 2024-02-10: qty 1

## 2024-02-10 MED ORDER — CHLORHEXIDINE GLUCONATE 0.12 % MT SOLN
15.0000 mL | Freq: Once | OROMUCOSAL | Status: AC
  Administered 2024-02-10: 15 mL via OROMUCOSAL
  Filled 2024-02-10: qty 15

## 2024-02-10 MED ORDER — INSULIN ASPART 100 UNIT/ML IJ SOLN
0.0000 [IU] | INTRAMUSCULAR | Status: DC | PRN
  Administered 2024-02-10: 6 [IU] via SUBCUTANEOUS

## 2024-02-10 MED ORDER — MIDAZOLAM HCL 2 MG/2ML IJ SOLN
INTRAMUSCULAR | Status: DC | PRN
Start: 2024-02-10 — End: 2024-02-10
  Administered 2024-02-10: 2 mg via INTRAVENOUS

## 2024-02-10 SURGICAL SUPPLY — 16 items
CATH ROBINSON RED A/P 16FR (CATHETERS) IMPLANT
CNTNR URN SCR LID CUP LEK RST (MISCELLANEOUS) ×2 IMPLANT
COVER MAYO STAND STRL (DRAPES) ×2 IMPLANT
DEVICE MYOSURE LITE (MISCELLANEOUS) IMPLANT
DEVICE MYOSURE REACH (MISCELLANEOUS) IMPLANT
DILATOR CANAL MILEX (MISCELLANEOUS) IMPLANT
GLOVE BIO SURGEON STRL SZ 6.5 (GLOVE) ×2 IMPLANT
GLOVE BIOGEL PI IND STRL 7.0 (GLOVE) ×4 IMPLANT
GOWN STRL REUS W/ TWL LRG LVL3 (GOWN DISPOSABLE) ×4 IMPLANT
KIT PROCEDURE FLUENT (KITS) ×2 IMPLANT
KIT TURNOVER KIT B (KITS) ×2 IMPLANT
PACK VAGINAL MINOR WOMEN LF (CUSTOM PROCEDURE TRAY) ×2 IMPLANT
PAD OB MATERNITY 11 LF (PERSONAL CARE ITEMS) ×2 IMPLANT
SEAL ROD LENS SCOPE MYOSURE (ABLATOR) ×2 IMPLANT
TOWEL GREEN STERILE FF (TOWEL DISPOSABLE) ×2 IMPLANT
UNDERPAD 30X36 HEAVY ABSORB (UNDERPADS AND DIAPERS) ×2 IMPLANT

## 2024-02-10 NOTE — Op Note (Signed)
 Pre Op Dx:   1. Endometrial mass 2. Thickened endometrium on ultrasound (0.44cm)  Post Op Dx:   1. Endometrial polyp 2. Thickened endometrium on ultrasound (0.44cm) 3. Cervical polyp  Procedure:   1. Cervical polypectomy 2. Hysteroscopy with Dilation and Curettage with Myosure   Surgeon:  Dr. Meldon Sport Assistants:  None Anesthesia:  LMA   EBL:  5cc  IVF:  500cc UOP:  Voided prior to arrival to OR Fluid Deficit:  75cc   Drains:  None Specimen removed:  Cervical polyp - sent to pathology  Endometrial polyp and curettings - sent to pathology Device(s) implanted: None Case Type:  Clean-contaminated Findings:  Normal-appearing ectocervix and endocervical canal. Small ~0.5cm cervical polyp noted. Endometrium atrophic with a broad-based polyp on the left sidewall. Bilateral tubal ostia visualized. Pessary in place (removed prior to procedure). No evidence of vaginal ulceration. Complications: None Indications:  68 y.o. postmenopausal G2P2 with endometrial mass and thickened endometrium on ultrasound.   Description of each procedure:  After informed consent was obtained the patient was taken to the operating room in the dorsal supine position.  After administration of general anesthesia, the patient was placed in the dorsal lithotomy position and prepped and draped in the usual sterile fashion. A pre-operative time-out was completed. The cervical polyp was grasped with a ring forcep and twisted and removed at the stalk. The anterior lip of the cervix was grasped with a single-tooth tenaculum and the cervix was serially dilated to accommodate the hysteroscope.The hysteroscope was advanced and the findings as above was noted. The Myosure Reach was used to resect the endometrial polyp and endometrium. A sharp banjo curette was used to curettage the endometrium. The single-tooth tenaculum was removed and its sites were made hemostatic with silver nitrate and pressure. Adequate hemostasis was  noted.  The patient was awakened and extubated and appeared to have tolerated the procedure well.  All counts were correct.  Disposition:  PACU  Meldon Sport, DO

## 2024-02-10 NOTE — Anesthesia Procedure Notes (Signed)
 Procedure Name: LMA Insertion Date/Time: 02/10/2024 12:37 PM  Performed by: Rally Ouch C, CRNAPre-anesthesia Checklist: Patient identified, Emergency Drugs available, Suction available and Patient being monitored Patient Re-evaluated:Patient Re-evaluated prior to induction Oxygen Delivery Method: Circle System Utilized Preoxygenation: Pre-oxygenation with 100% oxygen Induction Type: IV induction Ventilation: Mask ventilation without difficulty LMA: LMA inserted LMA Size: 4.0 Number of attempts: 1 Airway Equipment and Method: Bite block Placement Confirmation: positive ETCO2 Tube secured with: Tape Dental Injury: Teeth and Oropharynx as per pre-operative assessment

## 2024-02-10 NOTE — Anesthesia Postprocedure Evaluation (Signed)
 Anesthesia Post Note  Patient: Sara Mcclain  Procedure(s) Performed: DILATATION AND CURETTAGE /HYSTEROSCOPY (Uterus) MYOSURE RESECTION (Uterus)     Patient location during evaluation: PACU Anesthesia Type: General Level of consciousness: awake and alert Pain management: pain level controlled Vital Signs Assessment: post-procedure vital signs reviewed and stable Respiratory status: spontaneous breathing, nonlabored ventilation and respiratory function stable Cardiovascular status: blood pressure returned to baseline and stable Postop Assessment: no apparent nausea or vomiting Anesthetic complications: no  No notable events documented.  Last Vitals:  Vitals:   02/10/24 1330 02/10/24 1345  BP: 133/61 128/77  Pulse: (!) 59 66  Resp: 11 14  Temp:  36.7 C  SpO2: 94% 94%    Last Pain:  Vitals:   02/10/24 1345  TempSrc:   PainSc: 0-No pain                 Nawaf Strange,W. EDMOND

## 2024-02-10 NOTE — Anesthesia Preprocedure Evaluation (Addendum)
 Anesthesia Evaluation  Patient identified by MRN, date of birth, ID band Patient awake    Reviewed: Allergy & Precautions, H&P , NPO status , Patient's Chart, lab work & pertinent test results  Airway Mallampati: III  TM Distance: >3 FB Neck ROM: Full  Mouth opening: Limited Mouth Opening  Dental no notable dental hx. (+) Edentulous Upper, Edentulous Lower, Dental Advisory Given   Pulmonary neg pulmonary ROS   Pulmonary exam normal breath sounds clear to auscultation       Cardiovascular negative cardio ROS  Rhythm:Regular Rate:Normal     Neuro/Psych negative neurological ROS  negative psych ROS   GI/Hepatic negative GI ROS, Neg liver ROS,,,  Endo/Other  diabetes, Type 2, Oral Hypoglycemic Agents    Renal/GU negative Renal ROS  negative genitourinary   Musculoskeletal   Abdominal   Peds  Hematology negative hematology ROS (+)   Anesthesia Other Findings   Reproductive/Obstetrics negative OB ROS                             Anesthesia Physical Anesthesia Plan  ASA: 2  Anesthesia Plan: General   Post-op Pain Management: Ofirmev  IV (intra-op)* and Toradol IV (intra-op)*   Induction: Intravenous  PONV Risk Score and Plan: 4 or greater and Ondansetron , Dexamethasone, Propofol infusion and TIVA  Airway Management Planned: LMA  Additional Equipment:   Intra-op Plan:   Post-operative Plan: Extubation in OR  Informed Consent: I have reviewed the patients History and Physical, chart, labs and discussed the procedure including the risks, benefits and alternatives for the proposed anesthesia with the patient or authorized representative who has indicated his/her understanding and acceptance.     Dental advisory given  Plan Discussed with: CRNA  Anesthesia Plan Comments:        Anesthesia Quick Evaluation

## 2024-02-10 NOTE — Transfer of Care (Signed)
 Immediate Anesthesia Transfer of Care Note  Patient: Sara Mcclain  Procedure(s) Performed: DILATATION AND CURETTAGE /HYSTEROSCOPY (Uterus) MYOSURE RESECTION (Uterus)  Patient Location: PACU  Anesthesia Type:General  Level of Consciousness: awake, oriented, and sedated  Airway & Oxygen Therapy: Patient Spontanous Breathing and Patient connected to face mask oxygen  Post-op Assessment: Report given to RN and Post -op Vital signs reviewed and stable  Post vital signs: Reviewed and stable  Last Vitals:  Vitals Value Taken Time  BP 137/71 02/10/24 13:15  Temp    Pulse 67 02/10/24 13:16  Resp 10 02/10/24 13:16  SpO2 96 % 02/10/24 13:16  Vitals shown include unfiled device data.  Last Pain:  Vitals:   02/10/24 1009  TempSrc:   PainSc: 6       Patients Stated Pain Goal: 1 (02/10/24 1009)  Complications: No notable events documented.

## 2024-02-10 NOTE — Interval H&P Note (Signed)
 History and Physical Interval Note:  02/10/2024 10:55 AM  Sara Mcclain  has presented today for surgery, with the diagnosis of Endometrial mass.  The various methods of treatment have been discussed with the patient and family. After consideration of risks, benefits and other options for treatment, the patient has consented to  Procedure(s): DILATATION AND CURETTAGE /HYSTEROSCOPY (N/A) with Myosure as a surgical intervention.  The patient's history has been reviewed, patient examined, no change in status, stable for surgery.  I have reviewed the patient's chart and labs.  Questions were answered to the patient's satisfaction.     Waneta Fitting

## 2024-02-11 ENCOUNTER — Encounter (HOSPITAL_COMMUNITY): Payer: Self-pay | Admitting: Obstetrics and Gynecology

## 2024-02-11 LAB — SURGICAL PATHOLOGY

## 2024-02-25 DIAGNOSIS — Z4889 Encounter for other specified surgical aftercare: Secondary | ICD-10-CM | POA: Diagnosis not present

## 2024-02-25 DIAGNOSIS — Z4689 Encounter for fitting and adjustment of other specified devices: Secondary | ICD-10-CM | POA: Diagnosis not present

## 2024-02-25 DIAGNOSIS — N840 Polyp of corpus uteri: Secondary | ICD-10-CM | POA: Diagnosis not present

## 2024-03-08 ENCOUNTER — Other Ambulatory Visit

## 2024-03-08 ENCOUNTER — Ambulatory Visit

## 2024-03-10 DIAGNOSIS — E1165 Type 2 diabetes mellitus with hyperglycemia: Secondary | ICD-10-CM | POA: Diagnosis not present

## 2024-03-17 DIAGNOSIS — E785 Hyperlipidemia, unspecified: Secondary | ICD-10-CM | POA: Diagnosis not present

## 2024-03-17 DIAGNOSIS — E1165 Type 2 diabetes mellitus with hyperglycemia: Secondary | ICD-10-CM | POA: Diagnosis not present

## 2024-03-17 DIAGNOSIS — R809 Proteinuria, unspecified: Secondary | ICD-10-CM | POA: Diagnosis not present

## 2024-03-17 DIAGNOSIS — E781 Pure hyperglyceridemia: Secondary | ICD-10-CM | POA: Diagnosis not present

## 2024-04-05 ENCOUNTER — Ambulatory Visit (HOSPITAL_BASED_OUTPATIENT_CLINIC_OR_DEPARTMENT_OTHER)
Admission: RE | Admit: 2024-04-05 | Discharge: 2024-04-05 | Disposition: A | Discharge status: Home / Self Care | Source: Ambulatory Visit | Attending: Family Medicine | Admitting: Family Medicine

## 2024-04-05 ENCOUNTER — Encounter (HOSPITAL_BASED_OUTPATIENT_CLINIC_OR_DEPARTMENT_OTHER): Payer: Self-pay

## 2024-04-05 DIAGNOSIS — Z1231 Encounter for screening mammogram for malignant neoplasm of breast: Secondary | ICD-10-CM | POA: Insufficient documentation

## 2024-04-05 DIAGNOSIS — E2839 Other primary ovarian failure: Secondary | ICD-10-CM

## 2024-04-05 DIAGNOSIS — M85851 Other specified disorders of bone density and structure, right thigh: Secondary | ICD-10-CM | POA: Diagnosis not present

## 2024-04-05 DIAGNOSIS — Z780 Asymptomatic menopausal state: Secondary | ICD-10-CM | POA: Diagnosis not present

## 2024-04-08 ENCOUNTER — Other Ambulatory Visit: Payer: Self-pay | Admitting: Family Medicine

## 2024-04-08 DIAGNOSIS — R928 Other abnormal and inconclusive findings on diagnostic imaging of breast: Secondary | ICD-10-CM

## 2024-04-14 ENCOUNTER — Other Ambulatory Visit: Payer: Self-pay | Admitting: Family Medicine

## 2024-04-14 ENCOUNTER — Ambulatory Visit
Admission: RE | Admit: 2024-04-14 | Discharge: 2024-04-14 | Disposition: A | Discharge status: Home / Self Care | Source: Ambulatory Visit | Attending: Family Medicine | Admitting: Family Medicine

## 2024-04-14 ENCOUNTER — Inpatient Hospital Stay
Admission: RE | Admit: 2024-04-14 | Discharge: 2024-04-14 | Discharge status: Home / Self Care | Source: Ambulatory Visit | Attending: Family Medicine | Admitting: Family Medicine

## 2024-04-14 DIAGNOSIS — N6489 Other specified disorders of breast: Secondary | ICD-10-CM | POA: Diagnosis not present

## 2024-04-14 DIAGNOSIS — R928 Other abnormal and inconclusive findings on diagnostic imaging of breast: Secondary | ICD-10-CM | POA: Diagnosis not present

## 2024-04-15 ENCOUNTER — Other Ambulatory Visit: Payer: Self-pay | Admitting: Family Medicine

## 2024-04-15 DIAGNOSIS — N6489 Other specified disorders of breast: Secondary | ICD-10-CM

## 2024-06-16 DIAGNOSIS — R809 Proteinuria, unspecified: Secondary | ICD-10-CM | POA: Diagnosis not present

## 2024-06-16 DIAGNOSIS — E1165 Type 2 diabetes mellitus with hyperglycemia: Secondary | ICD-10-CM | POA: Diagnosis not present

## 2024-06-23 DIAGNOSIS — E781 Pure hyperglyceridemia: Secondary | ICD-10-CM | POA: Diagnosis not present

## 2024-06-23 DIAGNOSIS — E1165 Type 2 diabetes mellitus with hyperglycemia: Secondary | ICD-10-CM | POA: Diagnosis not present

## 2024-06-23 DIAGNOSIS — Z23 Encounter for immunization: Secondary | ICD-10-CM | POA: Diagnosis not present

## 2024-06-23 DIAGNOSIS — E785 Hyperlipidemia, unspecified: Secondary | ICD-10-CM | POA: Diagnosis not present

## 2024-06-23 DIAGNOSIS — R809 Proteinuria, unspecified: Secondary | ICD-10-CM | POA: Diagnosis not present

## 2024-10-18 ENCOUNTER — Encounter
